# Patient Record
Sex: Male | Born: 1995 | Race: White | Hispanic: No | Marital: Single
Health system: Southern US, Community
[De-identification: ages and names within clinical notes are randomized; demographics above are authoritative.]

## PROBLEM LIST (undated history)

## (undated) HISTORY — PX: APPENDECTOMY: SHX54

---
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
## 2019-07-31 ENCOUNTER — Ambulatory Visit: Payer: Managed Care, Other (non HMO) | Attending: Internal Medicine

## 2019-07-31 DIAGNOSIS — Z23 Encounter for immunization: Secondary | ICD-10-CM

## 2019-07-31 NOTE — Progress Notes (Signed)
   Covid-19 Vaccination Clinic  Name:  Cory Wise    MRN: [id number] DOB: 30-Oct-1995  07/31/2019  Mr. Cory Wise was observed post Covid-19 immunization for 15 minutes without incident. He was provided with Vaccine Information Sheet and instruction to access the V-Safe system.   Mr. Cory Wise was instructed to call 911 with any severe reactions post vaccine: Marland Kitchen Difficulty breathing  . Swelling of face and throat  . A fast heartbeat  . A bad rash all over body  . Dizziness and weakness   Immunizations Administered    Name Date Dose VIS Date Route   Pfizer COVID-19 Vaccine 07/31/2019 11:03 AM 0.3 mL 04/23/2019 Intramuscular   Manufacturer: [employer], [employer]   Lot: [id number]   NDC: [address]-9

## 2019-08-24 ENCOUNTER — Ambulatory Visit: Payer: Managed Care, Other (non HMO) | Attending: Internal Medicine

## 2019-08-24 DIAGNOSIS — Z23 Encounter for immunization: Secondary | ICD-10-CM

## 2019-08-24 NOTE — Progress Notes (Signed)
   Covid-19 Vaccination Clinic  Name:  Cory Wise    MRN: [id number] DOB: [birth year]  08/24/2019  Mr. Cory Wise was observed post Covid-19 immunization for 15 minutes without incident. He was provided with Vaccine Information Sheet and instruction to access the V-Safe system.   Mr. Cory Wise was instructed to call 911 with any severe reactions post vaccine: Marland Kitchen Difficulty breathing  . Swelling of face and throat  . A fast heartbeat  . A bad rash all over body  . Dizziness and weakness   Immunizations Administered    Name Date Dose VIS Date Route   Pfizer COVID-19 Vaccine 08/24/2019  2:58 PM 0.3 mL 04/23/2019 Intramuscular   Manufacturer: [employer], [employer]   Lot: [id number]   NDC: [address]-5

## 2019-09-14 ENCOUNTER — Ambulatory Visit (INDEPENDENT_AMBULATORY_CARE_PROVIDER_SITE_OTHER): Payer: Managed Care, Other (non HMO)

## 2019-09-14 ENCOUNTER — Other Ambulatory Visit: Payer: Self-pay

## 2019-09-14 ENCOUNTER — Ambulatory Visit
Admission: EM | Admit: 2019-09-14 | Discharge: 2019-09-14 | Disposition: A | Discharge status: Home / Self Care | Payer: Managed Care, Other (non HMO) | Attending: Family Medicine | Admitting: Family Medicine

## 2019-09-14 DIAGNOSIS — R350 Frequency of micturition: Secondary | ICD-10-CM

## 2019-09-14 DIAGNOSIS — N200 Calculus of kidney: Secondary | ICD-10-CM

## 2019-09-14 DIAGNOSIS — R3914 Feeling of incomplete bladder emptying: Secondary | ICD-10-CM

## 2019-09-14 LAB — URINALYSIS, COMPLETE (UACMP) WITH MICROSCOPIC
Bacteria, UA: NONE SEEN
Glucose, UA: NEGATIVE mg/dL
Ketones, ur: 15 mg/dL — AB
Leukocytes,Ua: NEGATIVE
Nitrite: NEGATIVE
Specific Gravity, Urine: >1.03 — ABNORMAL HIGH (ref 1.005–1.030)
Squamous Epithelial / HPF: NONE SEEN (ref 0–5)
pH: 6 (ref 5.0–8.0)

## 2019-09-14 MED ORDER — TAMSULOSIN HCL 0.4 MG PO CAPS: 0.4000 mg | ORAL_CAPSULE | Freq: Every day | ORAL | 0 refills | Status: AC

## 2019-09-14 NOTE — Discharge Instructions (Addendum)
It was very nice seeing you today in clinic. Thank you for entrusting me with your care.   Back off on the Gatorade as the sodium content increases your risk for stone development. Increase fluid intake. The medication I am giving you will make you void more. Also, need to flush the urinary tract in effort to pass the stone.   Make arrangements to follow up with urology. I have provided you the name and office contact information for an excellent local provider. If your symptoms/condition worsens, please seek follow up care either here or in the ER. Please remember, our [facility] providers are "right here with you" when you need [place].   Again, it was my pleasure to take care of you today. Thank you for choosing our clinic. I hope that you start to feel better quickly.   Quentin Mulling, MSN, APRN, FNP-C, CEN Advanced Practice Provider  [facility]

## 2019-09-14 NOTE — ED Provider Notes (Signed)
Cory Wise, [place]   Name: Cory Wise DOB: 1996-03-28 MRN: [id number] CSN: [id number] PCP: System, Pcp Not In  Arrival date and time:  09/14/19 0804  Chief Complaint:  Urinary Frequency  NOTE: Prior to seeing the patient today, I have reviewed the triage nursing documentation and vital signs. Clinical staff has updated patient's PMH/PSHx, current medication list, and drug allergies/intolerances to ensure comprehensive history available to assist in medical decision making.   History:   HPI: Cory Wise is a 24 y.o. male who presents today with complaints of LUTS x 1 year. Symptoms worse over the course of the last 10 days. Patient reports that he has had urinary frequency and the sensation of incomplete bladder emptying. Patient has intermittent pain in his lower back with (+) radiation into his flanks BILATERALLY. Patient denies any nausea, vomiting, fever, or chills. He denies PMH (+) for recurrent urinary tract infections or urolithiasis. Patient has a (+) family history of urolithiasis per his report. Patient reporting that he was seen by [facility] urgent care last week. Notes reviewed. UA (+) for trace blood; reflex C&S (-). STI testing was also (-). Patient reports that he consumes Gatorade and ETOH on a daily basis. Patient has not identified any provoking or relieving factors.   History reviewed. No pertinent past medical history.  Past Surgical History:  Procedure Laterality Date  . APPENDECTOMY      Family History  Problem Relation Age of Onset  . Healthy Mother   . Healthy Father     Social History   Tobacco Use  . Smoking status: Current Every Day Smoker    Packs/day: 1.00    Types: Cigarettes  . Smokeless tobacco: Never Used  Substance Use Topics  . Alcohol use: Yes    Comment: daily  . Drug use: Yes    Types: Marijuana    There are no problems to display for this patient.   Home Medications:    No outpatient medications have been marked as taking  for the 09/14/19 encounter [facility] Encounter).    Allergies:   Patient has no known allergies.  Review of Systems (ROS):  Review of systems NEGATIVE unless otherwise noted in narrative H&P section.   Vital Signs: Today's Vitals   09/14/19 0825 09/14/19 0828 09/14/19 0947  BP:  (!) 146/98   Pulse:  95   Resp:  18   Temp:  97.9 F (36.6 C)   TempSrc:  Oral   SpO2:  99%   Weight: 180 lb (81.6 kg)    Height: 5\' 7"  (1.702 m)    PainSc: 0-No pain  2     Physical Exam: Physical Exam  Constitutional: He is oriented to person, place, and time and well-developed, well-nourished, and in no distress.  HENT:  Head: Normocephalic and atraumatic.  Eyes: Pupils are equal, round, and reactive to light.  Cardiovascular: Normal rate, regular rhythm, normal heart sounds and intact distal pulses.  Pulmonary/Chest: Effort normal and breath sounds normal.  Abdominal: Soft. Normal appearance. He exhibits no distension. There is no abdominal tenderness. There is CVA tenderness (Mild "ache" with percussion on the RIGHT).  Genitourinary:    Genitourinary Comments: Exam deferred. No penile pain, bleeding, or discharge. Denies testicular symptoms. STI testing performed earlier this week at [facility].    Neurological: He is alert and oriented to person, place, and time. Gait normal.  Skin: Skin is warm and dry. No rash noted. He is not diaphoretic.  Psychiatric: Memory, affect and judgment  normal. His mood appears anxious.  Nursing note and vitals reviewed.   Urgent Care Treatments / Results:   Orders Placed This Encounter  Procedures  . DG Abdomen 1 View  . Urinalysis, Complete w Microscopic    LABS: PLEASE NOTE: all labs that were ordered this encounter are listed, however only abnormal results are displayed. Labs Reviewed  URINALYSIS, COMPLETE (UACMP) WITH MICROSCOPIC - Abnormal; Notable for the following components:      Result Value   Color, Urine AMBER (*)    Specific Gravity, Urine >1.030  (*)    Hgb urine dipstick TRACE (*)    Bilirubin Urine SMALL (*)    Ketones, ur 15 (*)    Protein, ur TRACE (*)    All other components within normal limits    EKG: -None  RADIOLOGY: DG Abdomen 1 View  Result Date: 09/14/2019 CLINICAL DATA:  Left urinary tract symptoms, microscopic hematuria EXAM: ABDOMEN - 1 VIEW COMPARISON:  None. FINDINGS: Bowel gas overlies the left kidney. No discrete calculi identified. Ill-defined increased density at the lower pole of the left kidney could reflect punctate calculi. Bowel gas pattern is unremarkable. No significant osseous abnormality. IMPRESSION: Question of ill-defined punctate calculi at the lower pole of the left kidney. Electronically Signed   By: Guadlupe Spanish M.D.   On: 09/14/2019 09:19   PROCEDURES: Procedures  MEDICATIONS RECEIVED THIS VISIT: Medications - No data to display  PERTINENT CLINICAL COURSE NOTES/UPDATES:   Initial Impression / Assessment and Plan / Urgent Care Course:  Pertinent labs & imaging results that were available during my care of the patient were personally reviewed by me and considered in my medical decision making (see lab/imaging section of note for values and interpretations).  Cory Wise is a 24 y.o. male who presents to [facility] today with complaints of Urinary Frequency  Patient is well appearing overall in clinic today. He does not appear to be in any acute distress. Presenting symptoms (see HPI) and exam as documented above. UA today negative for infection; 0-5 WBC/hpf, 0-5 RBC/hpf, and no nitrites, LE, or bacteria. Sample (+) for trace Hgb. KUB (+) for urinary calculus in the LEFT kidney. Area described as "punctate". Unsure if this is the etiology of his symptoms, however they could very well be contributory.  Placing on tamsulosin daily. Patient advised to increase water intake. Strainer provider; patient instructed to take any passed stones to urology for analysis. Given the chronicity of  his LUTS, patient would benefit from being seen by urology for further evaluation and +/- CT stones study. Name and office contact information provided on today's AVS for McGowan, PA-C. Patient advised the he will need to contact the office to schedule an appointment to be seen.   Discussed follow up with primary care physician in 1 week for re-evaluation. I have reviewed the follow up and strict return precautions for any new or worsening symptoms. Patient is aware of symptoms that would be deemed urgent/emergent, and would thus require further evaluation either here or in the emergency department. At the time of discharge, he verbalized understanding and consent with the discharge plan as it was reviewed with him. All questions were fielded by provider and/or clinic staff prior to patient discharge.    Final Clinical Impressions / Urgent Care Diagnoses:   Final diagnoses:  Kidney stone  Urinary frequency  Feeling of incomplete bladder emptying    New Prescriptions:  [place] Controlled Substance Registry consulted? Not Applicable  Meds ordered this  encounter  Medications  . tamsulosin (FLOMAX) 0.4 MG CAPS capsule    Sig: Take 1 capsule (0.4 mg total) by mouth daily.    Dispense:  30 capsule    Refill:  0    Recommended Follow up Care:  Patient encouraged to follow up with the following provider within the specified time frame, or sooner as dictated by the severity of his symptoms. As always, he was instructed that for any urgent/emergent care needs, he should seek care either here or in the emergency department for more immediate evaluation.  Follow-up Information    Schedule an appointment as soon as possible for a visit  with Zara Council A, PA-C.   Specialties: Urology, Radiology Contact information: [place] [id number] [phone]         NOTE: This note was prepared using Dragon dictation software along with smaller phrase technology.  Despite my best ability to proofread, there is the potential that transcriptional errors may still occur from this process, and are completely unintentional.    Karen Kitchens, NP 09/15/19 1417

## 2019-09-14 NOTE — ED Triage Notes (Signed)
Patient states that he has been urinating frequently and doesn't feel like he can finish urinating with some low back pain and flank pain. Reports that he saw [facility] walk-in clinic on Monday and screened for STDs and testing was negative. Reports that he was waiting on a urine culture from his urine but he had no growth of urine. Patient states that his UA dip did show a trace of blood and he was concerned for UTI. Reports family history of kidney stones. States that symptoms have been on and off for one year but have been constant for 10 days.

## 2019-09-27 ENCOUNTER — Other Ambulatory Visit: Payer: Self-pay | Admitting: *Deleted

## 2019-09-27 DIAGNOSIS — N200 Calculus of kidney: Secondary | ICD-10-CM

## 2019-09-28 ENCOUNTER — Encounter: Payer: Self-pay | Admitting: Urology

## 2019-09-28 ENCOUNTER — Other Ambulatory Visit
Admission: RE | Admit: 2019-09-28 | Discharge: 2019-09-28 | Disposition: A | Discharge status: Home / Self Care | Payer: Managed Care, Other (non HMO) | Attending: Urology | Admitting: Urology

## 2019-09-28 ENCOUNTER — Other Ambulatory Visit: Payer: Self-pay

## 2019-09-28 ENCOUNTER — Ambulatory Visit (INDEPENDENT_AMBULATORY_CARE_PROVIDER_SITE_OTHER): Payer: Self-pay | Admitting: Urology

## 2019-09-28 VITALS — BP 134/81 | HR 79 | Ht 67.0 in | Wt 179.0 lb

## 2019-09-28 DIAGNOSIS — N200 Calculus of kidney: Secondary | ICD-10-CM

## 2019-09-28 DIAGNOSIS — R350 Frequency of micturition: Secondary | ICD-10-CM

## 2019-09-28 LAB — URINALYSIS, COMPLETE (UACMP) WITH MICROSCOPIC
Bacteria, UA: NONE SEEN
Bilirubin Urine: NEGATIVE
Glucose, UA: NEGATIVE mg/dL
Ketones, ur: NEGATIVE mg/dL
Leukocytes,Ua: NEGATIVE
Nitrite: NEGATIVE
Protein, ur: NEGATIVE mg/dL
Specific Gravity, Urine: >1.03 — ABNORMAL HIGH (ref 1.005–1.030)
Squamous Epithelial / LPF: NONE SEEN (ref 0–5)
pH: 5.5 (ref 5.0–8.0)

## 2019-09-28 LAB — BLADDER SCAN AMB NON-IMAGING: Scan Result: 13

## 2019-09-28 NOTE — Progress Notes (Signed)
   09/28/19 8:58 AM   Georgeanna Lea [place] 1995-12-11 [id number]  CC: Urinary frequency, possible kidney stone  HPI: I saw Mr. Koors today in urology clinic for evaluation of urinary frequency and a possible kidney stone.  He was recently seen in urgent care with vague complaints of back pain and urinary frequency.  A KUB was performed that showed a possible left lower pole punctate kidney stone.  Urinalysis and STD testing have all been completely benign.  Urinalysis today was completely negative.  His primary complaint is urinary frequency 1 to 2 hours during the day.  He has nocturia 0-1 times per night.  He denies any weak stream, straining to urinate, urgency, or incontinence.  He denies any gross hematuria or urethral discharge.  He does report some left-sided flank pain when he drinks heavily.  There is no prior imaging to review aside from the KUB.  He denies any history of retention or UTIs.  PVR is normal in clinic today at 13 mL.  He previously was drinking quite a bit of Gatorade which he has cut back on and his urinary frequency has improved with drinking just water during the day, and moving to a healthier diet.   PMH: No past medical history on file.  Surgical History: Past Surgical History:  Procedure Laterality Date  . APPENDECTOMY      Family History: Family History  Problem Relation Age of Onset  . Healthy Mother   . Healthy Father     Social History:  reports that he has been smoking cigarettes. He has been smoking about 1.00 pack per day. He has never used smokeless tobacco. He reports current alcohol use. He reports current drug use. Drug: Marijuana.  Physical Exam: BP 134/81   Pulse 79   Ht 5\' 7"  (1.702 m)   Wt 179 lb (81.2 kg)   BMI 28.04 kg/m    Constitutional:  Alert and oriented, No acute distress. Cardiovascular: No clubbing, cyanosis, or edema. Respiratory: Normal respiratory effort, no increased work of breathing. GI: Abdomen is soft, nontender,  nondistended, no abdominal masses GU: No CVA tenderness, circumcised phallus without lesions, widely patent meatus, testicles 20 cc and descended bilaterally  Laboratory Data: Reviewed, see HPI  Pertinent Imaging: I have personally reviewed the KUB, no clear evidence of nephrolithiasis, small calcification at the base of the left kidney but this appears outside the collecting system  Assessment & Plan:   In summary, he is a healthy 24 year old male with urinary frequency that has improved with behavioral strategies alone, and left-sided leg pain with heavy episodes of drinking.  Urinalysis and STD testing are negative.  We discussed possible etiologies for his urinary frequency including his prior high salt diet and Gatorade, as well as the effect of stress on the bladder.  I did recommend a renal ultrasound for better evaluation of his flank pain with heavy drinking to evaluate for possible UPJ obstruction.  On review his KUB, this does not appear to be a stone, and I think this is an incidental finding, however will be better evaluated with the ultrasound.  We discussed return precautions at length.  Call with renal ultrasound results Behavioral strategies discussed regarding urinary frequency  24, MD 09/28/2019  [facility] [address], [address] [place], [place] [phone]

## 2019-10-04 ENCOUNTER — Ambulatory Visit
Admission: RE | Admit: 2019-10-04 | Discharge: 2019-10-04 | Disposition: A | Discharge status: Home / Self Care | Payer: Managed Care, Other (non HMO) | Source: Ambulatory Visit | Attending: Urology | Admitting: Urology

## 2019-10-04 ENCOUNTER — Other Ambulatory Visit: Payer: Self-pay

## 2019-10-04 DIAGNOSIS — N200 Calculus of kidney: Secondary | ICD-10-CM | POA: Diagnosis present

## 2019-10-05 ENCOUNTER — Telehealth: Payer: Self-pay

## 2019-10-05 NOTE — Telephone Encounter (Signed)
Called pt no answer. Left detailed message for pt per DPR. Advised pt to call back for questions or concerns.  

## 2019-10-05 NOTE — Telephone Encounter (Signed)
-----   Message from Sondra Come, MD sent at 10/05/2019  8:16 AM EDT ----- Kidney US normal, follow up if symptoms not improving with strategies discussed in clinic, thanks  Legrand Rams, MD 10/05/2019

## 2019-12-24 ENCOUNTER — Other Ambulatory Visit: Payer: Self-pay

## 2019-12-24 ENCOUNTER — Ambulatory Visit
Admission: EM | Admit: 2019-12-24 | Discharge: 2019-12-24 | Disposition: A | Discharge status: Home / Self Care | Payer: Managed Care, Other (non HMO) | Attending: Internal Medicine | Admitting: Internal Medicine

## 2019-12-24 ENCOUNTER — Encounter: Payer: Self-pay | Admitting: Emergency Medicine

## 2019-12-24 DIAGNOSIS — R300 Dysuria: Secondary | ICD-10-CM | POA: Insufficient documentation

## 2019-12-24 DIAGNOSIS — R319 Hematuria, unspecified: Secondary | ICD-10-CM

## 2019-12-24 LAB — URINALYSIS, COMPLETE (UACMP) WITH MICROSCOPIC
Bilirubin Urine: NEGATIVE
Glucose, UA: NEGATIVE mg/dL
Ketones, ur: 80 mg/dL — AB
Leukocytes,Ua: NEGATIVE
Nitrite: NEGATIVE
Protein, ur: 30 mg/dL — AB
RBC / HPF: >50 RBC/hpf (ref 0–5)
Specific Gravity, Urine: >1.03 — ABNORMAL HIGH (ref 1.005–1.030)
Squamous Epithelial / HPF: NONE SEEN (ref 0–5)
pH: 5.5 (ref 5.0–8.0)

## 2019-12-24 MED ORDER — CEPHALEXIN 500 MG PO CAPS: 500.0000 mg | ORAL_CAPSULE | Freq: Four times a day (QID) | ORAL | 0 refills | Status: AC

## 2019-12-24 NOTE — Discharge Instructions (Addendum)
Discussed pt to follow up with urology that he has seen in the past  This could be a possible kidney stone unable to diagnosis for sure.  Will send urine off to ensure no uti, will give rx pt is not to have it filled until culture returns.  Stay hydrated  Can take motrin or pain

## 2019-12-24 NOTE — ED Provider Notes (Signed)
[facility]    CSN: [id number] Arrival date & time: 12/24/19  7026      History   Chief Complaint Chief Complaint  Patient presents with  . Hematuria  . Back Pain    HPI Cory Wise is a 24 y.o. male.   Was seen a while back for blood in urine went to see urology and had scans done. They think possible kidney stone unsure. Yesterday began having lt side flank pain with heavy blood in urine then after voiding x5 the blood began to decrease and pain went away. Pt is a drinker and has not drank for the past 3 weeks and started to drink water. Denies any n/v/d. No pain at this time.      History reviewed. No pertinent past medical history.  There are no problems to display for this patient.   Past Surgical History:  Procedure Laterality Date  . APPENDECTOMY         Home Medications    Prior to Admission medications   Medication Sig Start Date End Date Taking? Authorizing Provider  cephALEXin (KEFLEX) 500 MG capsule Take 1 capsule (500 mg total) by mouth 4 (four) times daily. 12/24/19   Coralyn Mark, NP  tamsulosin (FLOMAX) 0.4 MG CAPS capsule Take 1 capsule (0.4 mg total) by mouth daily. 09/14/19   Verlee Monte, NP    Family History Family History  Problem Relation Age of Onset  . Healthy Mother   . Healthy Father     Social History Social History   Tobacco Use  . Smoking status: Current Every Day Smoker    Packs/day: 1.00    Types: Cigarettes  . Smokeless tobacco: Never Used  Vaping Use  . Vaping Use: Never used  Substance Use Topics  . Alcohol use: Yes    Comment: daily  . Drug use: Yes    Types: Marijuana     Allergies   Patient has no known allergies.   Review of Systems Review of Systems  Constitutional: Negative.   Respiratory: Negative.   Cardiovascular: Negative.   Gastrointestinal:       Lt flank pain yesterday none today   Genitourinary: Positive for difficulty urinating and hematuria.  Neurological:  Negative.      Physical Exam Triage Vital Signs ED Triage Vitals  Enc Vitals Group     BP 12/24/19 0835 127/80     Pulse Rate 12/24/19 0835 91     Resp 12/24/19 0835 16     Temp 12/24/19 0835 98.6 F (37 C)     Temp Source 12/24/19 0835 Oral     SpO2 12/24/19 0835 100 %     Weight 12/24/19 0833 180 lb (81.6 kg)     Height 12/24/19 0833 5\' 7"  (1.702 m)     Head Circumference --      Peak Flow --      Pain Score 12/24/19 0833 0     Pain Loc --      Pain Edu? --      Excl. in GC? --    No data found.  Updated Vital Signs BP 127/80 (BP Location: Right Arm)   Pulse 91   Temp 98.6 F (37 C) (Oral)   Resp 16   Ht 5\' 7"  (1.702 m)   Wt 180 lb (81.6 kg)   SpO2 100%   BMI 28.19 kg/m   Visual Acuity     Physical Exam Cardiovascular:     Rate and  Rhythm: Normal rate.  Pulmonary:     Effort: Pulmonary effort is normal.  Abdominal:     General: Abdomen is flat. Bowel sounds are normal.     Tenderness: There is left CVA tenderness.  Skin:    General: Skin is warm.     Capillary Refill: Capillary refill takes less than 2 seconds.  Neurological:     General: No focal deficit present.     Mental Status: He is alert.      UC Treatments / Results  Labs (all labs ordered are listed, but only abnormal results are displayed) Labs Reviewed  URINALYSIS, COMPLETE (UACMP) WITH MICROSCOPIC - Abnormal; Notable for the following components:      Result Value   APPearance HAZY (*)    Specific Gravity, Urine >1.030 (*)    Hgb urine dipstick LARGE (*)    Ketones, ur 80 (*)    Protein, ur 30 (*)    Non Squamous Epithelial PRESENT (*)    Bacteria, UA FEW (*)    All other components within normal limits  URINE CULTURE    EKG   Radiology No results found.  Procedures Procedures (including critical care time)  Medications Ordered in UC Medications - No data to display  Initial Impression / Assessment and Plan / UC Course  I have reviewed the triage vital signs and  the nursing notes.  Pertinent labs & imaging results that were available during my care of the patient were reviewed by me and considered in my medical decision making (see chart for details).     Discussed pt to follow up with urology that he has seen in the past  This could be a possible kidney stone unable to diagnosis for sure.  Will send urine off to ensure no uti, will give rx pt is not to have it filled until culture returns.  Stay hydrated  Can take motrin or pain   Final Clinical Impressions(s) / UC Diagnoses   Final diagnoses:  Hematuria, unspecified type  Dysuria     Discharge Instructions     Discussed pt to follow up with urology that he has seen in the past  This could be a possible kidney stone unable to diagnosis for sure.  Will send urine off to ensure no uti, will give rx pt is not to have it filled until culture returns.  Stay hydrated  Can take motrin or pain     ED Prescriptions    Medication Sig Dispense Auth. Provider   cephALEXin (KEFLEX) 500 MG capsule Take 1 capsule (500 mg total) by mouth 4 (four) times daily. 20 capsule Coralyn Mark, NP     PDMP not reviewed this encounter.   Coralyn Mark, NP 12/24/19 [phone]

## 2019-12-24 NOTE — ED Triage Notes (Signed)
Patient states that he was having blood in his urine that started yesterday.  Patient also reports lower back pain that started last night.

## 2019-12-25 LAB — URINE CULTURE: Culture: NO GROWTH

## 2019-12-28 ENCOUNTER — Encounter: Payer: Self-pay | Admitting: Urology

## 2019-12-28 ENCOUNTER — Ambulatory Visit (INDEPENDENT_AMBULATORY_CARE_PROVIDER_SITE_OTHER): Payer: Managed Care, Other (non HMO) | Admitting: Urology

## 2019-12-28 ENCOUNTER — Other Ambulatory Visit: Payer: Self-pay

## 2019-12-28 VITALS — BP 124/79 | HR 72 | Ht 67.0 in | Wt 170.0 lb

## 2019-12-28 DIAGNOSIS — R310 Gross hematuria: Secondary | ICD-10-CM | POA: Diagnosis not present

## 2019-12-28 DIAGNOSIS — N200 Calculus of kidney: Secondary | ICD-10-CM | POA: Diagnosis not present

## 2019-12-28 NOTE — Progress Notes (Signed)
   12/28/2019 8:56 AM   Cory Wise [birth year] [id number]  Reason for visit: Gross hematuria and left flank pain  HPI: I saw Cory Wise in urology clinic for the above issues.  He is a healthy 24 year old male who I saw in clinic in May 2021 for urinary frequency and a possible kidney stone when a punctate stone was seen in the left kidney on KUB.  His urinary frequency improved with behavioral strategies of decreasing alcohol, energy drinks, and Gatorade during the day, and switching to more water.  We also performed an ultrasound to rule out a left UPJ obstruction as he was having occasional left-sided pain with heavy drinking, but this showed no hydronephrosis, and a possible 6 mm left nonobstructing left upper pole stone.  He was seen in urgent care on 8/13 with acute onset of gross hematuria and severe left-sided 10 out of 10 flank pain worrisome for stone event.  He reports that this resolved within 24 hours and he has had no further pain or gross hematuria.  There was no imaging performed.  Urinalysis at that time showed greater than 50 RBCs, 0 WBCs, and urine culture was negative.  He denies any complaints today.  We discussed the most likely passed the left-sided stone that was previously seen on ultrasound.  We discussed options including observation and stone prevention, versus CT stone protocol.  He would like to avoid a CT secondary to cost and radiation exposure which is very reasonable as he is asymptomatic and likely passed his stone.  We discussed return precautions at length including recurrent hematuria or flank pain.  We discussed general stone prevention strategies including adequate hydration with goal of producing 2.5 L of urine daily, increasing citric acid intake, increasing calcium intake during high oxalate meals, minimizing animal protein, and decreasing salt intake. Information about dietary recommendations given today.   RTC 1 year KUB prior, sooner if  problems   Sondra Come, MD  [facility] [address], [address] [place], [place] [address] [phone]

## 2019-12-28 NOTE — Patient Instructions (Signed)
Kidney Stones  Kidney stones are solid, rock-like deposits that form inside of the kidneys. The kidneys are a pair of organs that make urine. A kidney stone may form in a kidney and move into other parts of the urinary tract, including the tubes that connect the kidneys to the bladder (ureters), the bladder, and the tube that carries urine out of the body (urethra). As the stone moves through these areas, it can cause intense pain and block the flow of urine. Kidney stones are created when high levels of certain minerals are found in the urine. The stones are usually passed out of the body through urination, but in some cases, medical treatment may be needed to remove them. What are the causes? Kidney stones may be caused by:  A condition in which certain glands produce too much parathyroid hormone (primary hyperparathyroidism), which causes too much calcium buildup in the blood.  A buildup of uric acid crystals in the bladder (hyperuricosuria). Uric acid is a chemical that the body produces when you eat certain foods. It usually exits the body in the urine.  Narrowing (stricture) of one or both of the ureters.  A kidney blockage that is present at birth (congenital obstruction).  Past surgery on the kidney or the ureters, such as gastric bypass surgery. What increases the risk? The following factors may make you more likely to develop this condition:  Having had a kidney stone in the past.  Having a family history of kidney stones.  Not drinking enough water.  Eating a diet that is high in protein, salt (sodium), or sugar.  Being overweight or obese. What are the signs or symptoms? Symptoms of a kidney stone may include:  Pain in the side of the abdomen, right below the ribs (flank pain). Pain usually spreads (radiates) to the groin.  Needing to urinate frequently or urgently.  Painful urination.  Blood in the urine (hematuria).  Nausea.  Vomiting.  Fever and chills. How  is this diagnosed? This condition may be diagnosed based on:  Your symptoms and medical history.  A physical exam.  Blood tests.  Urine tests. These may be done before and after the stone passes out of your body through urination.  Imaging tests, such as a CT scan, abdominal X-ray, or ultrasound.  A procedure to examine the inside of the bladder (cystoscopy). How is this treated? Treatment for kidney stones depends on the size, location, and makeup of the stones. Kidney stones will often pass out of the body through urination. You may need to:  Increase your fluid intake to help pass the stone. In some cases, you may be given fluids through an IV and may need to be monitored at the hospital.  Take medicine for pain.  Make changes in your diet to help prevent kidney stones from coming back. Sometimes, medical procedures are needed to remove a kidney stone. This may involve:  A procedure to break up kidney stones using: ? A focused beam of light (laser therapy). ? Shock waves (extracorporeal shock wave lithotripsy).  Surgery to remove kidney stones. This may be needed if you have severe pain or have stones that block your urinary tract. Follow these instructions at home: Medicines  Take over-the-counter and prescription medicines only as told by your health care provider.  Ask your health care provider if the medicine prescribed to you requires you to avoid driving or using heavy machinery. Eating and drinking  Drink enough fluid to keep your urine pale yellow.   You may be instructed to drink at least 8-10 glasses of water each day. This will help you pass the kidney stone.  If directed, change your diet. This may include: ? Limiting how much sodium you eat. ? Eating more fruits and vegetables. ? Limiting how much animal protein--such as red meat, poultry, fish, and eggs--you eat.  Follow instructions from your health care provider about eating or drinking  restrictions. General instructions  Collect urine samples as told by your health care provider. You may need to collect a urine sample: ? 24 hours after you pass the stone. ? 8-12 weeks after passing the kidney stone, and every 6-12 months after that.  Strain your urine every time you urinate, for as long as directed. Use the strainer that your health care provider recommends.  Do not throw out the kidney stone after passing it. Keep the stone so it can be tested by your health care provider. Testing the makeup of your kidney stone may help prevent you from getting kidney stones in the future.  Keep all follow-up visits as told by your health care provider. This is important. You may need follow-up X-rays or ultrasounds to make sure that your stone has passed. How is this prevented? To prevent another kidney stone:  Drink enough fluid to keep your urine pale yellow. This is the best way to prevent kidney stones.  Eat a healthy diet and follow recommendations from your health care provider about foods to avoid. You may be instructed to eat a low-protein diet. Recommendations vary depending on the type of kidney stone that you have.  Maintain a healthy weight. Where to find more information  [employer] (NKF): www.kidney.org  [facility] ([facility]): www.urologyhealth.org Contact a health care provider if:  You have pain that gets worse or does not get better with medicine. Get help right away if:  You have a fever or chills.  You develop severe pain.  You develop new abdominal pain.  You faint.  You are unable to urinate. Summary  Kidney stones are solid, rock-like deposits that form inside of the kidneys.  Kidney stones can cause nausea, vomiting, blood in the urine, abdominal pain, and the urge to urinate frequently.  Treatment for kidney stones depends on the size, location, and makeup of the stones. Kidney stones will often pass out of the body  through urination.  Kidney stones can be prevented by drinking enough fluids, eating a healthy diet, and maintaining a healthy weight. This information is not intended to replace advice given to you by your health care provider. Make sure you discuss any questions you have with your health care provider. Document Revised: 09/15/2018 Document Reviewed: 09/15/2018 Elsevier Patient Education  2020 [employer]   Dietary Guidelines to Help Prevent Kidney Stones Kidney stones are deposits of minerals and salts that form inside your kidneys. Your risk of developing kidney stones may be greater depending on your diet, your lifestyle, the medicines you take, and whether you have certain medical conditions. Most people can reduce their chances of developing kidney stones by following the instructions below. Depending on your overall health and the type of kidney stones you tend to develop, your dietitian may give you more specific instructions. What are tips for following this plan? Reading food labels  Choose foods with "no salt added" or "low-salt" labels. Limit your sodium intake to less than 1500 mg per day.  Choose foods with calcium for each meal and snack. Try to eat   about 300 mg of calcium at each meal. Foods that contain 200-500 mg of calcium per serving include: ? 8 oz (237 ml) of milk, fortified nondairy milk, and fortified fruit juice. ? 8 oz (237 ml) of kefir, yogurt, and soy yogurt. ? 4 oz (118 ml) of tofu. ? 1 oz of cheese. ? 1 cup (300 g) of dried figs. ? 1 cup (91 g) of cooked broccoli. ? 1-3 oz can of sardines or mackerel.  Most people need 1000 to 1500 mg of calcium each day. Talk to your dietitian about how much calcium is recommended for you. Shopping  Buy plenty of fresh fruits and vegetables. Most people do not need to avoid fruits and vegetables, even if they contain nutrients that may contribute to kidney stones.  When shopping for convenience foods, choose: ? Whole  pieces of fruit. ? Premade salads with dressing on the side. ? Low-fat fruit and yogurt smoothies.  Avoid buying frozen meals or prepared deli foods.  Look for foods with live cultures, such as yogurt and kefir. Cooking  Do not add salt to food when cooking. Place a salt shaker on the table and allow each person to add his or her own salt to taste.  Use vegetable protein, such as beans, textured vegetable protein (TVP), or tofu instead of meat in pasta, casseroles, and soups. Meal planning   Eat less salt, if told by your dietitian. To do this: ? Avoid eating processed or premade food. ? Avoid eating fast food.  Eat less animal protein, including cheese, meat, poultry, or fish, if told by your dietitian. To do this: ? Limit the number of times you have meat, poultry, fish, or cheese each week. Eat a diet free of meat at least 2 days a week. ? Eat only one serving each day of meat, poultry, fish, or seafood. ? When you prepare animal protein, cut pieces into small portion sizes. For most meat and fish, one serving is about the size of one deck of cards.  Eat at least 5 servings of fresh fruits and vegetables each day. To do this: ? Keep fruits and vegetables on hand for snacks. ? Eat 1 piece of fruit or a handful of berries with breakfast. ? Have a salad and fruit at lunch. ? Have two kinds of vegetables at dinner.  Limit foods that are high in a substance called oxalate. These include: ? Spinach. ? Rhubarb. ? Beets. ? Potato chips and french fries. ? Nuts.  If you regularly take a diuretic medicine, make sure to eat at least 1-2 fruits or vegetables high in potassium each day. These include: ? Avocado. ? Banana. ? Orange, prune, carrot, or tomato juice. ? Baked potato. ? Cabbage. ? Beans and split peas. General instructions   Drink enough fluid to keep your urine clear or pale yellow. This is the most important thing you can do.  Talk to your health care provider and  dietitian about taking daily supplements. Depending on your health and the cause of your kidney stones, you may be advised: ? Not to take supplements with vitamin C. ? To take a calcium supplement. ? To take a daily probiotic supplement. ? To take other supplements such as magnesium, fish oil, or vitamin B6.  Take all medicines and supplements as told by your health care provider.  Limit alcohol intake to no more than 1 drink a day for nonpregnant women and 2 drinks a day for men. One drink equals 12 oz   of beer, 5 oz of wine, or 1 oz of hard liquor.  Lose weight if told by your health care provider. Work with your dietitian to find strategies and an eating plan that works best for you. What foods are not recommended? Limit your intake of the following foods, or as told by your dietitian. Talk to your dietitian about specific foods you should avoid based on the type of kidney stones and your overall health. Grains Breads. Bagels. Rolls. Baked goods. Salted crackers. Cereal. Pasta. Vegetables Spinach. Rhubarb. Beets. Canned vegetables. Pickles. Olives. Meats and other protein foods Nuts. Nut butters. Large portions of meat, poultry, or fish. Salted or cured meats. Deli meats. Hot dogs. Sausages. Dairy Cheese. Beverages Regular soft drinks. Regular vegetable juice. Seasonings and other foods Seasoning blends with salt. Salad dressings. Canned soups. Soy sauce. Ketchup. Barbecue sauce. Canned pasta sauce. Casseroles. Pizza. Lasagna. Frozen meals. Potato chips. French fries. Summary  You can reduce your risk of kidney stones by making changes to your diet.  The most important thing you can do is drink enough fluid. You should drink enough fluid to keep your urine clear or pale yellow.  Ask your health care provider or dietitian how much protein from animal sources you should eat each day, and also how much salt and calcium you should have each day. This information is not intended to  replace advice given to you by your health care provider. Make sure you discuss any questions you have with your health care provider. Document Revised: 08/19/2018 Document Reviewed: 04/09/2016 Elsevier Patient Education  2020 [employer]   

## 2020-12-26 ENCOUNTER — Ambulatory Visit: Payer: Managed Care, Other (non HMO) | Admitting: Urology

## 2021-08-17 IMAGING — CR DG ABDOMEN 1V
2 series · 2 of 2 positions shown · non-contrast
Comparison: None.

CLINICAL DATA: Left urinary tract symptoms, microscopic hematuria

EXAM:
ABDOMEN - 1 VIEW

[abdomen kub (1 of 2)]
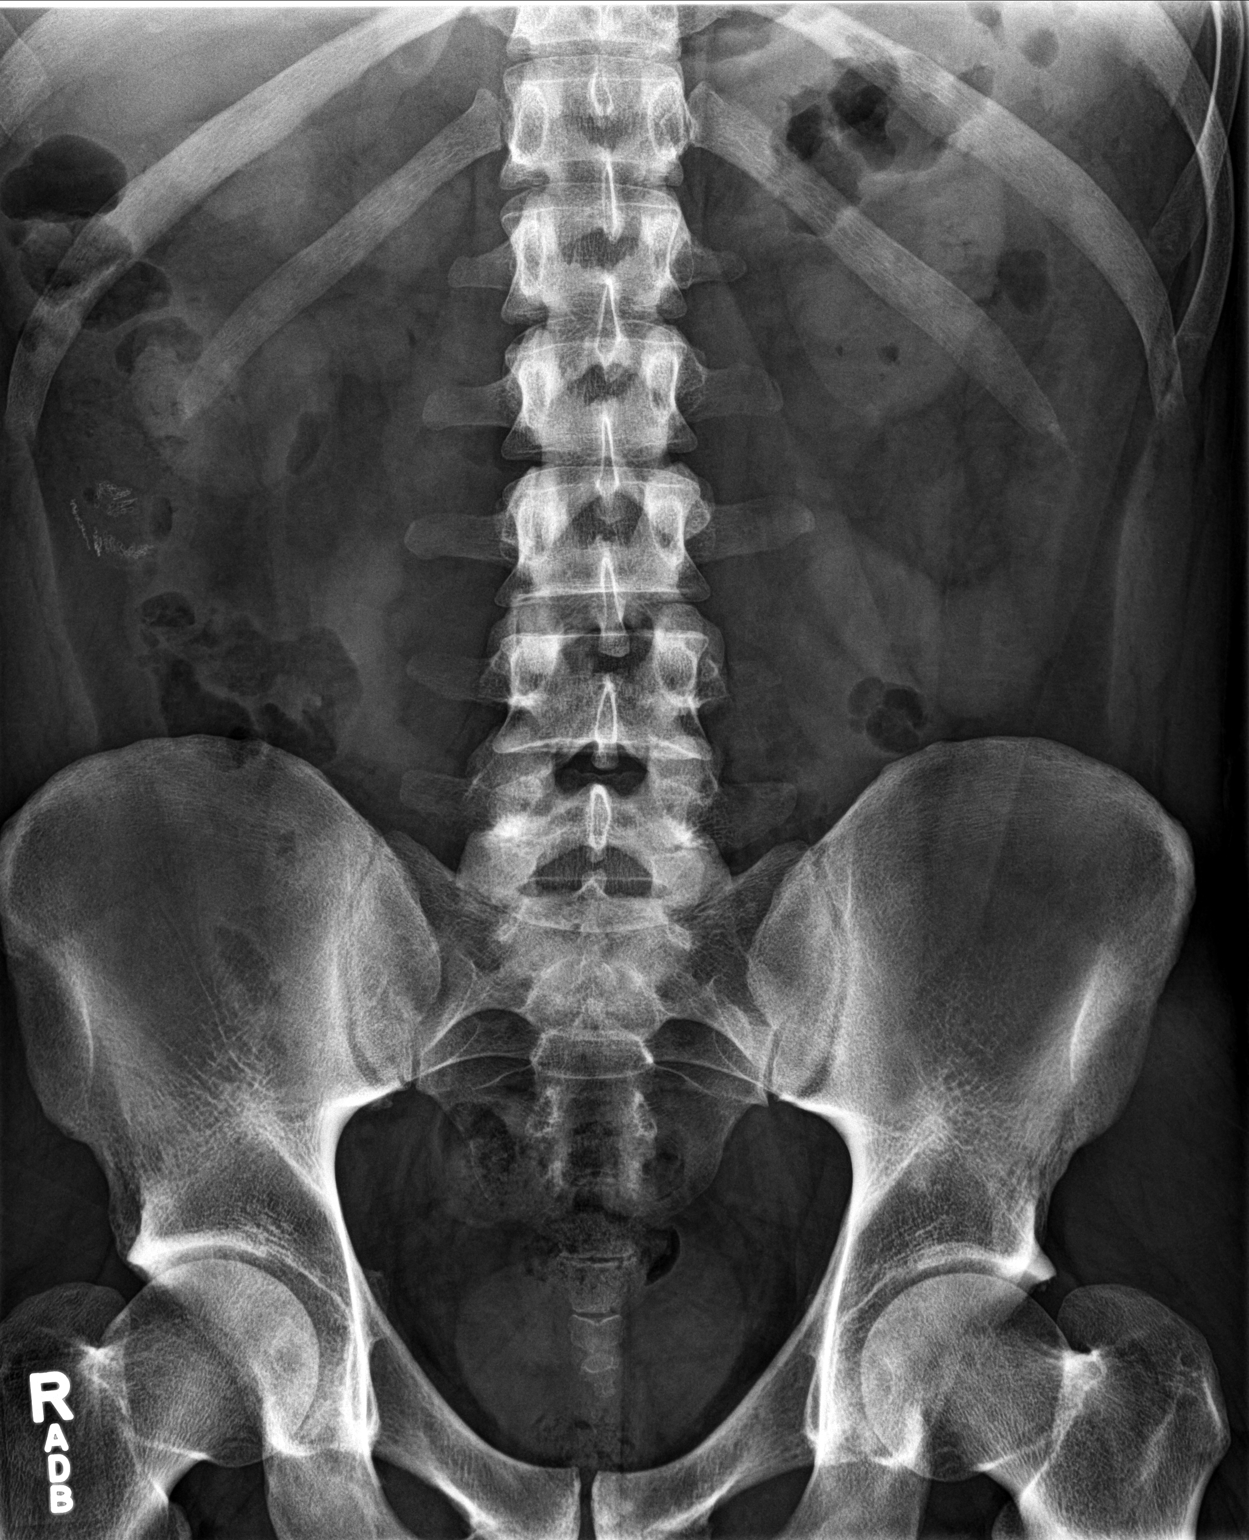

[abdomen kub (2 of 2)]
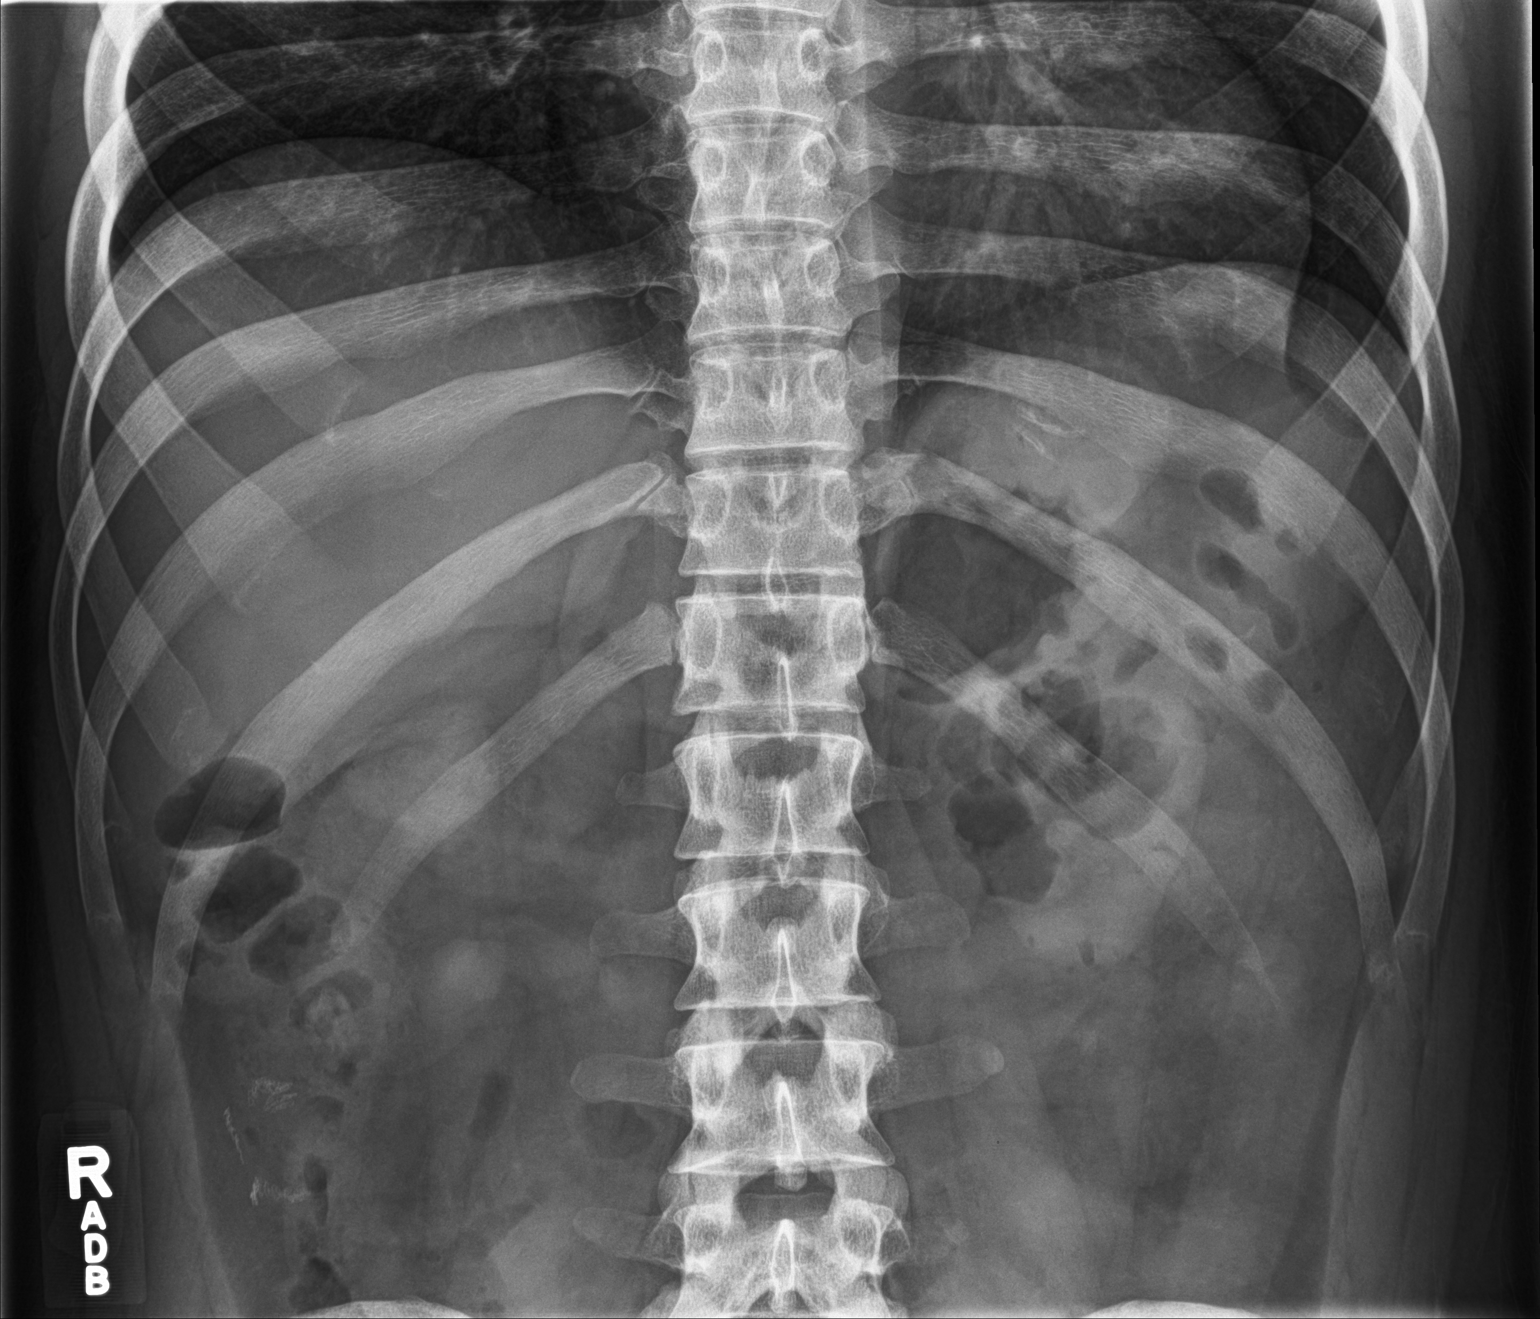

[2 of 2 positions shown; findings below may reference images not displayed]

FINDINGS: Bowel gas overlies the left kidney. No discrete calculi identified.
Ill-defined increased density at the lower pole of the left kidney
could reflect punctate calculi. Bowel gas pattern is unremarkable.
No significant osseous abnormality.
IMPRESSION: Question of ill-defined punctate calculi at the lower pole of the
left kidney.
